# Patient Record
Sex: Female | Born: 1995 | Race: Black or African American | Hispanic: No | Marital: Single | Smoking: Never smoker
Health system: Southern US, Community
[De-identification: ages and names within clinical notes are randomized; demographics above are authoritative.]

## PROBLEM LIST (undated history)

## (undated) DIAGNOSIS — H539 Unspecified visual disturbance: Secondary | ICD-10-CM

## (undated) DIAGNOSIS — R51 Headache: Secondary | ICD-10-CM

## (undated) DIAGNOSIS — F419 Anxiety disorder, unspecified: Secondary | ICD-10-CM

## (undated) HISTORY — PX: NO PAST SURGERIES: SHX2092

---
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
## 2019-09-20 ENCOUNTER — Other Ambulatory Visit: Payer: Self-pay

## 2019-09-20 ENCOUNTER — Ambulatory Visit
Admission: EM | Admit: 2019-09-20 | Discharge: 2019-09-20 | Disposition: A | Discharge status: Home / Self Care | Payer: Medicaid Other | Attending: Physician Assistant | Admitting: Physician Assistant

## 2019-09-20 ENCOUNTER — Encounter: Payer: Self-pay | Admitting: Emergency Medicine

## 2019-09-20 DIAGNOSIS — M545 Low back pain, unspecified: Secondary | ICD-10-CM

## 2019-09-20 MED ORDER — MELOXICAM 7.5 MG PO TABS: 7.5000 mg | ORAL_TABLET | Freq: Every day | ORAL | 0 refills | Status: AC

## 2019-09-20 MED ORDER — TIZANIDINE HCL 2 MG PO TABS
2.0000 mg | ORAL_TABLET | Freq: Three times a day (TID) | ORAL | 0 refills | Status: AC | PRN
Start: 2019-09-20 — End: ?

## 2019-09-20 NOTE — ED Provider Notes (Signed)
[facility]    CSN: [id number] Arrival date & time: 09/20/19  1647      History   Chief Complaint Chief Complaint  Patient presents with  . Back Pain    HPI Debbie Higgins is a 24 y.o. female.   24 year old female comes in for 4 day history of right low back pain after lifting. Pain is constant, worse with laying/sitting, better with standing.  Pain can radiate to the right leg. Numbness/tingling to the toes. Occasionally to the dorsal aspect ot the foot.  Denies saddle anesthesia, loss of bladder or bowel control.  Has been trying topical medications without relief.     Past Medical History:  Diagnosis Date  . Anxiety   . Headache(784.0)   . Vision abnormalities    States "need glasses"    Patient Active Problem List   Diagnosis Date Noted  . Oppositional defiant disorder 10/06/2011  . MDD (major depressive disorder), single episode, severe ([employer]) 10/02/2011  . PTSD (post-traumatic stress disorder) 10/02/2011    Past Surgical History:  Procedure Laterality Date  . NO PAST SURGERIES      OB History   No obstetric history on file.      Home Medications    Prior to Admission medications   Medication Sig Start Date End Date Taking? Authorizing Provider  buPROPion (WELLBUTRIN XL) 300 MG 24 hr tablet Take 1 tablet (300 mg total) by mouth daily. 10/07/11 10/06/12  Delight Hoh, MD  meloxicam (MOBIC) 7.5 MG tablet Take 1 tablet (7.5 mg total) by mouth daily. 09/20/19   Tasia Catchings, Micahel Omlor V, PA-C  tiZANidine (ZANAFLEX) 2 MG tablet Take 1 tablet (2 mg total) by mouth every 8 (eight) hours as needed for muscle spasms. 09/20/19   Ok Edwards, PA-C    Family History Family History  Problem Relation Age of Onset  . Alcohol abuse Mother   . Drug abuse Mother   . Mental illness Mother   . Hypertension Father   . Alcohol abuse Maternal Grandmother   . Alcohol abuse Maternal Grandfather     Social History Social History   Tobacco Use  . Smoking status: Never  Smoker  Substance Use Topics  . Alcohol use: No  . Drug use: No     Allergies   Patient has no known allergies.   Review of Systems Review of Systems  Reason unable to perform ROS: See HPI as above.     Physical Exam Triage Vital Signs ED Triage Vitals  Enc Vitals Group     BP 09/20/19 1710 120/76     Pulse Rate 09/20/19 1710 99     Resp 09/20/19 1710 18     Temp 09/20/19 1710 99 F (37.2 C)     Temp Source 09/20/19 1710 Oral     SpO2 09/20/19 1710 95 %     Weight --      Height --      Head Circumference --      Peak Flow --      Pain Score 09/20/19 1711 8     Pain Loc --      Pain Edu? --      Excl. in [facility]? --    No data found.  Updated Vital Signs BP 120/76 (BP Location: Left Arm)   Pulse 99   Temp 99 F (37.2 C) (Oral)   Resp 18   SpO2 95%   Visual Acuity Right Eye Distance:   Left Eye Distance:  Bilateral Distance:    Right Eye Near:   Left Eye Near:    Bilateral Near:     Physical Exam Constitutional:      General: She is not in acute distress.    Appearance: She is well-developed. She is not diaphoretic.  HENT:     Head: Normocephalic and atraumatic.  Eyes:     Conjunctiva/sclera: Conjunctivae normal.     Pupils: Pupils are equal, round, and reactive to light.  Cardiovascular:     Rate and Rhythm: Normal rate and regular rhythm.     Heart sounds: Normal heart sounds. No murmur. No friction rub. No gallop.   Pulmonary:     Effort: Pulmonary effort is normal. No accessory muscle usage or respiratory distress.     Breath sounds: Normal breath sounds. No stridor. No decreased breath sounds, wheezing, rhonchi or rales.  Musculoskeletal:     Comments: No tenderness on palpation of the spinous processes.  Tenderness to palpation of right lumbar region.  Decreased flexion of the back.  Full range of motion of the hips.  Strength normal and equal bilaterally. Sensation intact and equal bilaterally.  Negative straight leg raise  Skin:    General:  Skin is warm and dry.  Neurological:     Mental Status: She is alert and oriented to person, place, and time.      UC Treatments / Results  Labs (all labs ordered are listed, but only abnormal results are displayed) Labs Reviewed - No data to display  EKG   Radiology No results found.  Procedures Procedures (including critical care time)  Medications Ordered in UC Medications - No data to display  Initial Impression / Assessment and Plan / UC Course  I have reviewed the triage vital signs and the nursing notes.  Pertinent labs & imaging results that were available during my care of the patient were reviewed by me and considered in my medical decision making (see chart for details).    Start NSAID as directed. Muscle relaxant as needed. Ice/heat compresses. Expected course of healing discussed. Return precautions given.   Final Clinical Impressions(s) / UC Diagnoses   Final diagnoses:  Acute right-sided low back pain without sciatica    ED Prescriptions    Medication Sig Dispense Auth. Provider   meloxicam (MOBIC) 7.5 MG tablet Take 1 tablet (7.5 mg total) by mouth daily. 15 tablet Patricio Popwell V, PA-C   tiZANidine (ZANAFLEX) 2 MG tablet Take 1 tablet (2 mg total) by mouth every 8 (eight) hours as needed for muscle spasms. 15 tablet Belinda Fisher, PA-C     PDMP not reviewed this encounter.   Belinda Fisher, PA-C 09/20/19 1855

## 2019-09-20 NOTE — Discharge Instructions (Signed)
Start Mobic. Do not take ibuprofen (motrin/advil)/ naproxen (aleve) while on mobic. Tizanidine as needed, this can make you drowsy, so do not take if you are going to drive, operate heavy machinery, or make important decisions. Ice/heat compresses as needed. This can take up to 3-4 weeks to completely resolve, but you should be feeling better each week. Follow up with PCP/orthopedics if symptoms worsen, changes for reevaluation. If experience numbness/tingling of the inner thighs, loss of bladder or bowel control, go to the emergency department for evaluation.  ° °

## 2019-09-20 NOTE — ED Triage Notes (Signed)
Pt sts right lower back pain with radiation down right leg after picking up heavy box at work 4 days

## 2020-06-24 ENCOUNTER — Encounter (HOSPITAL_COMMUNITY): Payer: Self-pay

## 2020-06-24 ENCOUNTER — Emergency Department (HOSPITAL_COMMUNITY)
Admission: EM | Admit: 2020-06-24 | Discharge: 2020-06-24 | Disposition: A | Discharge status: Home / Self Care | Payer: Medicaid Other | Attending: Emergency Medicine | Admitting: Emergency Medicine

## 2020-06-24 ENCOUNTER — Other Ambulatory Visit: Payer: Self-pay

## 2020-06-24 DIAGNOSIS — M5442 Lumbago with sciatica, left side: Secondary | ICD-10-CM

## 2020-06-24 DIAGNOSIS — R059 Cough, unspecified: Secondary | ICD-10-CM | POA: Insufficient documentation

## 2020-06-24 MED ORDER — OXYCODONE HCL 5 MG PO TABS
5.0000 mg | ORAL_TABLET | Freq: Once | ORAL | Status: AC
  Administered 2020-06-24: 5 mg via ORAL
  Filled 2020-06-24: qty 1

## 2020-06-24 MED ORDER — ACETAMINOPHEN 500 MG PO TABS
1000.0000 mg | ORAL_TABLET | Freq: Once | ORAL | Status: AC
  Administered 2020-06-24: 1000 mg via ORAL
  Filled 2020-06-24: qty 2

## 2020-06-24 MED ORDER — DIAZEPAM 5 MG PO TABS
5.0000 mg | ORAL_TABLET | Freq: Once | ORAL | Status: AC
  Administered 2020-06-24: 5 mg via ORAL
  Filled 2020-06-24: qty 1

## 2020-06-24 MED ORDER — KETOROLAC TROMETHAMINE 15 MG/ML IJ SOLN
15.0000 mg | Freq: Once | INTRAMUSCULAR | Status: AC
Start: 2020-06-24 — End: 2020-06-24
  Administered 2020-06-24: 15 mg via INTRAMUSCULAR
  Filled 2020-06-24: qty 1

## 2020-06-24 NOTE — ED Triage Notes (Signed)
Pt states she coughed earlier and now has shooting pain to her mid lower back.

## 2020-06-24 NOTE — Discharge Instructions (Signed)

## 2020-06-24 NOTE — ED Provider Notes (Addendum)
[facility]-EMERGENCY DEPT Provider Note   CSN: [id number] Arrival date & time: 06/24/20  1915     History Chief Complaint  Patient presents with  . Back Pain    Debbie Higgins is a 25 y.o. female.  25 yo F with a chief complaints of low back pain.  This happened suddenly while she coughed.  She feels like sometimes it radiates down her left leg.  Has had pain like this in the past.  Got better on its own.  She denies any loss of bowel or bladder denies loss of peritoneal sensation denies numbness or weakness to the legs.  The history is provided by the patient.  Back Pain Location:  Lumbar spine Quality:  Aching Radiates to: L buttock. Pain severity:  Moderate Onset quality:  Sudden Duration:  2 hours Timing:  Constant Progression:  Worsening Chronicity:  New Relieved by:  Nothing Worsened by:  Nothing Ineffective treatments:  None tried Associated symptoms: no chest pain, no dysuria, no fever and no headaches        Past Medical History:  Diagnosis Date  . Anxiety   . Headache(784.0)   . Vision abnormalities    States "need glasses"    Patient Active Problem List   Diagnosis Date Noted  . Oppositional defiant disorder 10/06/2011  . MDD (major depressive disorder), single episode, severe (HCC) 10/02/2011  . PTSD (post-traumatic stress disorder) 10/02/2011    Past Surgical History:  Procedure Laterality Date  . NO PAST SURGERIES       OB History   No obstetric history on file.     Family History  Problem Relation Age of Onset  . Alcohol abuse Mother   . Drug abuse Mother   . Mental illness Mother   . Hypertension Father   . Alcohol abuse Maternal Grandmother   . Alcohol abuse Maternal Grandfather     Social History   Tobacco Use  . Smoking status: Never Smoker  Substance Use Topics  . Alcohol use: No  . Drug use: No    Home Medications Prior to Admission medications   Medication Sig Start Date End Date Taking?  Authorizing Provider  buPROPion (WELLBUTRIN XL) 300 MG 24 hr tablet Take 1 tablet (300 mg total) by mouth daily. 10/07/11 10/06/12  Chauncey Mann, MD  meloxicam (MOBIC) 7.5 MG tablet Take 1 tablet (7.5 mg total) by mouth daily. 09/20/19   Cathie Hoops, Amy V, PA-C  tiZANidine (ZANAFLEX) 2 MG tablet Take 1 tablet (2 mg total) by mouth every 8 (eight) hours as needed for muscle spasms. 09/20/19   Belinda Fisher, PA-C    Allergies    Patient has no known allergies.  Review of Systems   Review of Systems  Constitutional: Negative for chills and fever.  HENT: Negative for congestion and rhinorrhea.   Eyes: Negative for redness and visual disturbance.  Respiratory: Negative for shortness of breath and wheezing.   Cardiovascular: Negative for chest pain and palpitations.  Gastrointestinal: Negative for nausea and vomiting.  Genitourinary: Negative for dysuria and urgency.  Musculoskeletal: Positive for back pain. Negative for arthralgias and myalgias.  Skin: Negative for pallor and wound.  Neurological: Negative for dizziness and headaches.    Physical Exam Updated Vital Signs BP 114/80 (BP Location: Left Arm)   Pulse 90   Temp 97.7 F (36.5 C) (Oral)   Resp 16   SpO2 100%   Physical Exam Vitals and nursing note reviewed.  Constitutional:  General: She is not in acute distress.    Appearance: She is well-developed and well-nourished. She is not diaphoretic.  HENT:     Head: Normocephalic and atraumatic.  Eyes:     Extraocular Movements: EOM normal.     Pupils: Pupils are equal, round, and reactive to light.  Cardiovascular:     Rate and Rhythm: Normal rate and regular rhythm.     Heart sounds: No murmur heard. No friction rub. No gallop.   Pulmonary:     Effort: Pulmonary effort is normal.     Breath sounds: No wheezing or rales.  Abdominal:     General: There is no distension.     Palpations: Abdomen is soft.     Tenderness: There is no abdominal tenderness.  Musculoskeletal:         General: Tenderness present. No edema.     Cervical back: Normal range of motion and neck supple.     Comments: No midline spinal tenderness.  Pulse motor and sensation intact bilaterally.  No clonus.  Reflexes are 2+ and equal bilaterally.  Negative straight leg raise test bilaterally.  Skin:    General: Skin is warm and dry.  Neurological:     Mental Status: She is alert and oriented to person, place, and time.  Psychiatric:        Mood and Affect: Mood and affect normal.        Behavior: Behavior normal.     ED Results / Procedures / Treatments   Labs (all labs ordered are listed, but only abnormal results are displayed) Labs Reviewed - No data to display  EKG None  Radiology No results found.  Procedures Procedures   Medications Ordered in ED Medications  ketorolac (TORADOL) 15 MG/ML injection 15 mg (has no administration in time range)  acetaminophen (TYLENOL) tablet 1,000 mg (has no administration in time range)  oxyCODONE (Oxy IR/ROXICODONE) immediate release tablet 5 mg (has no administration in time range)  diazepam (VALIUM) tablet 5 mg (has no administration in time range)    ED Course  I have reviewed the triage vital signs and the nursing notes.  Pertinent labs & imaging results that were available during my care of the patient were reviewed by me and considered in my medical decision making (see chart for details).    MDM Rules/Calculators/A&P                          25 yo F with a chief complaints of low back pain.  Patient has had pain like this in the past.  This time it occurred after a violent cough.  No red flags.  Ambulatory.  We'll treat her pain here.  Have her follow-up with her family doctor.  7:44 PM:  I have discussed the diagnosis/risks/treatment options with the patient and believe the pt to be eligible for discharge home to follow-up with PCP. We also discussed returning to the ED immediately if new or worsening sx occur. We discussed  the sx which are most concerning (e.g., sudden worsening pain, fever, inability to tolerate by mouth) that necessitate immediate return. Medications administered to the patient during their visit and any new prescriptions provided to the patient are listed below.  Medications given during this visit Medications  ketorolac (TORADOL) 15 MG/ML injection 15 mg (has no administration in time range)  acetaminophen (TYLENOL) tablet 1,000 mg (has no administration in time range)  oxyCODONE (Oxy IR/ROXICODONE) immediate release tablet  5 mg (has no administration in time range)  diazepam (VALIUM) tablet 5 mg (has no administration in time range)     The patient appears reasonably screen and/or stabilized for discharge and I doubt any other medical condition or other [facility] requiring further screening, evaluation, or treatment in the ED at this time prior to discharge.   Final Clinical Impression(s) / ED Diagnoses Final diagnoses:  Acute left-sided low back pain with left-sided sciatica    Rx / DC Orders ED Discharge Orders    None       Melene Plan, DO 06/24/20 1944    Melene Plan, DO 06/24/20 1944

## 2022-04-04 ENCOUNTER — Ambulatory Visit
Admission: EM | Admit: 2022-04-04 | Discharge: 2022-04-04 | Disposition: A | Discharge status: Home / Self Care | Payer: Medicaid Other | Attending: Nurse Practitioner | Admitting: Nurse Practitioner

## 2022-04-04 DIAGNOSIS — J209 Acute bronchitis, unspecified: Secondary | ICD-10-CM

## 2022-04-04 MED ORDER — BENZONATATE 200 MG PO CAPS: 200.0000 mg | ORAL_CAPSULE | Freq: Three times a day (TID) | ORAL | 0 refills | Status: AC | PRN

## 2022-04-04 MED ORDER — ALBUTEROL SULFATE HFA 108 (90 BASE) MCG/ACT IN AERS: 1.0000 | INHALATION_SPRAY | Freq: Four times a day (QID) | RESPIRATORY_TRACT | 0 refills | Status: AC | PRN

## 2022-04-04 MED ORDER — PREDNISONE 20 MG PO TABS: 40.0000 mg | ORAL_TABLET | Freq: Every day | ORAL | 0 refills | Status: AC

## 2022-04-04 MED ORDER — DOXYCYCLINE HYCLATE 100 MG PO CAPS: 100.0000 mg | ORAL_CAPSULE | Freq: Two times a day (BID) | ORAL | 0 refills | Status: AC

## 2022-04-04 NOTE — Discharge Instructions (Signed)
I hope you feel better soon Follow-up with your PCP 2 to 3 days for recheck ER for any worsening symptoms

## 2022-04-04 NOTE — ED Provider Notes (Signed)
[facility]    CSN: [id number] Arrival date & time: 04/04/22  1843      History   Chief Complaint No chief complaint on file.   HPI Debbie Higgins is a 26 y.o. female presents for evaluation of cough.  Patient reports 2 months of a waxing and waning productive cough with purulent phlegm.  Endorses shortness of breath and chest wall pain with coughing.  Denies any fevers or chills, shortness of breath, sore throat, ear pain, or bodyaches.  No asthma history.  She is a previous vapor.  She has been using OTC cough medicine with minimal relief.  She is not vaccinated for flu or COVID.  No known sick contacts.  No other concerns at this time.  HPI  Past Medical History:  Diagnosis Date   Anxiety    Headache(784.0)    Vision abnormalities    States "need glasses"    Patient Active Problem List   Diagnosis Date Noted   Oppositional defiant disorder 10/06/2011   MDD (major depressive disorder), single episode, severe (HCC) 10/02/2011   PTSD (post-traumatic stress disorder) 10/02/2011    Past Surgical History:  Procedure Laterality Date   NO PAST SURGERIES      OB History   No obstetric history on file.      Home Medications    Prior to Admission medications   Medication Sig Start Date End Date Taking? Authorizing Provider  albuterol (VENTOLIN HFA) 108 (90 Base) MCG/ACT inhaler Inhale 1-2 puffs into the lungs every 6 (six) hours as needed for wheezing or shortness of breath. 04/04/22  Yes Talmadge Chad R, NP  benzonatate (TESSALON) 200 MG capsule Take 1 capsule (200 mg total) by mouth 3 (three) times daily as needed for cough. 04/04/22  Yes Talmadge Chad R, NP  doxycycline (VIBRAMYCIN) 100 MG capsule Take 1 capsule (100 mg total) by mouth 2 (two) times daily. 04/04/22  Yes Talmadge Chad R, NP  predniSONE (DELTASONE) 20 MG tablet Take 2 tablets (40 mg total) by mouth daily with breakfast for 5 days. 04/04/22 04/09/22 Yes Talmadge Chad R, NP  buPROPion (WELLBUTRIN XL) 300  MG 24 hr tablet Take 1 tablet (300 mg total) by mouth daily. 10/07/11 10/06/12  Chauncey Mann, MD  meloxicam (MOBIC) 7.5 MG tablet Take 1 tablet (7.5 mg total) by mouth daily. 09/20/19   Cathie Hoops, Amy V, PA-C  tiZANidine (ZANAFLEX) 2 MG tablet Take 1 tablet (2 mg total) by mouth every 8 (eight) hours as needed for muscle spasms. 09/20/19   Belinda Fisher, PA-C    Family History Family History  Problem Relation Age of Onset   Alcohol abuse Mother    Drug abuse Mother    Mental illness Mother    Hypertension Father    Alcohol abuse Maternal Grandmother    Alcohol abuse Maternal Grandfather     Social History Social History   Tobacco Use   Smoking status: Never  Substance Use Topics   Alcohol use: Yes    Comment: rare   Drug use: No     Allergies   Patient has no known allergies.   Review of Systems Review of Systems  HENT:  Positive for congestion.   Respiratory:  Positive for cough.        Chest wall pain with coughing     Physical Exam Triage Vital Signs ED Triage Vitals  Enc Vitals Group     BP 04/04/22 1911 108/76     Pulse Rate 04/04/22 1911  73     Resp 04/04/22 1911 20     Temp 04/04/22 1911 98.4 F (36.9 C)     Temp Source 04/04/22 1911 Oral     SpO2 04/04/22 1911 98 %     Weight --      Height --      Head Circumference --      Peak Flow --      Pain Score 04/04/22 1908 5     Pain Loc --      Pain Edu? --      Excl. in GC? --    No data found.  Updated Vital Signs BP 108/76 (BP Location: Left Arm)   Pulse 73   Temp 98.4 F (36.9 C) (Oral)   Resp 20   LMP 04/04/2022 (Exact Date)   SpO2 98%   Visual Acuity Right Eye Distance:   Left Eye Distance:   Bilateral Distance:    Right Eye Near:   Left Eye Near:    Bilateral Near:     Physical Exam Vitals and nursing note reviewed.  Constitutional:      General: She is not in acute distress.    Appearance: She is well-developed. She is not ill-appearing.  HENT:     Head: Normocephalic and  atraumatic.     Right Ear: Tympanic membrane and ear canal normal.     Left Ear: Tympanic membrane and ear canal normal.     Nose: Congestion present.     Mouth/Throat:     Mouth: Mucous membranes are moist.     Pharynx: Oropharynx is clear. Uvula midline. No posterior oropharyngeal erythema.     Tonsils: No tonsillar exudate or tonsillar abscesses.  Eyes:     Conjunctiva/sclera: Conjunctivae normal.     Pupils: Pupils are equal, round, and reactive to light.  Cardiovascular:     Rate and Rhythm: Normal rate and regular rhythm.     Heart sounds: Normal heart sounds.  Pulmonary:     Effort: Pulmonary effort is normal. No respiratory distress.     Breath sounds: Normal breath sounds. No stridor. No wheezing, rhonchi or rales.  Chest:     Chest wall: No tenderness.  Musculoskeletal:     Cervical back: Normal range of motion and neck supple.  Lymphadenopathy:     Cervical: No cervical adenopathy.  Skin:    General: Skin is warm and dry.  Neurological:     General: No focal deficit present.     Mental Status: She is alert and oriented to person, place, and time.  Psychiatric:        Mood and Affect: Mood normal.        Behavior: Behavior normal.      UC Treatments / Results  Labs (all labs ordered are listed, but only abnormal results are displayed) Labs Reviewed - No data to display  EKG   Radiology No results found.  Procedures Procedures (including critical care time)  Medications Ordered in UC Medications - No data to display  Initial Impression / Assessment and Plan / UC Course  I have reviewed the triage vital signs and the nursing notes.  Pertinent labs & imaging results that were available during my care of the patient were reviewed by me and considered in my medical decision making (see chart for details).    Doxycycline as prescribed Albuterol and Tessalon. Prednisone daily Rest and fluids Follow-up with PCP 2 to 3 days for recheck ER for any  worsening symptoms  I hope you feel better soon! Final Clinical Impressions(s) / UC Diagnoses   Final diagnoses:  Acute bronchitis, unspecified organism     Discharge Instructions      I hope you feel better soon Follow-up with your PCP 2 to 3 days for recheck ER for any worsening symptoms    ED Prescriptions     Medication Sig Dispense Auth. Provider   albuterol (VENTOLIN HFA) 108 (90 Base) MCG/ACT inhaler Inhale 1-2 puffs into the lungs every 6 (six) hours as needed for wheezing or shortness of breath. 1 each Revonda Standard, NP   benzonatate (TESSALON) 200 MG capsule Take 1 capsule (200 mg total) by mouth 3 (three) times daily as needed for cough. 20 capsule Talmadge Chad R, NP   predniSONE (DELTASONE) 20 MG tablet Take 2 tablets (40 mg total) by mouth daily with breakfast for 5 days. 10 tablet Talmadge Chad R, NP   doxycycline (VIBRAMYCIN) 100 MG capsule Take 1 capsule (100 mg total) by mouth 2 (two) times daily. 20 capsule Revonda Standard, NP      PDMP not reviewed this encounter.   Revonda Standard, NP 04/04/22 1929

## 2022-04-04 NOTE — ED Triage Notes (Signed)
Pt presents with cough, SOB, congestion, sharp pain in left lower lung.  Cough productive for green mucous with specks of blood. Has been fighting back to back resp illnesses for 2 mos.  Breathing is tight. OTC cough meds are not working.   Has been having palpitations a few days ago, feeling like she has to catch her breath. Hasn't had one in 4-5 days.
